# Patient Record
Sex: Male | Born: 1990 | Race: White | Hispanic: No | Marital: Single | Smoking: Current every day smoker
Health system: Southern US, Community
[De-identification: ages and names within clinical notes are randomized; demographics above are authoritative.]

---
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
## 2012-08-02 ENCOUNTER — Encounter (HOSPITAL_COMMUNITY): Payer: Self-pay | Admitting: *Deleted

## 2012-08-02 ENCOUNTER — Emergency Department (HOSPITAL_COMMUNITY)
Admission: EM | Admit: 2012-08-02 | Discharge: 2012-08-02 | Disposition: A | Discharge status: Home / Self Care | Payer: 59 | Attending: Emergency Medicine | Admitting: Emergency Medicine

## 2012-08-02 ENCOUNTER — Emergency Department (HOSPITAL_COMMUNITY): Payer: 59

## 2012-08-02 DIAGNOSIS — S61419A Laceration without foreign body of unspecified hand, initial encounter: Secondary | ICD-10-CM

## 2012-08-02 DIAGNOSIS — S60151A Contusion of right little finger with damage to nail, initial encounter: Secondary | ICD-10-CM

## 2012-08-02 DIAGNOSIS — Y929 Unspecified place or not applicable: Secondary | ICD-10-CM | POA: Insufficient documentation

## 2012-08-02 DIAGNOSIS — S6000XA Contusion of unspecified finger without damage to nail, initial encounter: Secondary | ICD-10-CM | POA: Insufficient documentation

## 2012-08-02 DIAGNOSIS — S61209A Unspecified open wound of unspecified finger without damage to nail, initial encounter: Secondary | ICD-10-CM | POA: Insufficient documentation

## 2012-08-02 DIAGNOSIS — F17200 Nicotine dependence, unspecified, uncomplicated: Secondary | ICD-10-CM | POA: Insufficient documentation

## 2012-08-02 DIAGNOSIS — Y939 Activity, unspecified: Secondary | ICD-10-CM | POA: Insufficient documentation

## 2012-08-02 DIAGNOSIS — S6990XA Unspecified injury of unspecified wrist, hand and finger(s), initial encounter: Secondary | ICD-10-CM

## 2012-08-02 MED ORDER — HYDROCODONE-ACETAMINOPHEN 5-325 MG PO TABS: 2.0000 | ORAL_TABLET | ORAL | Status: DC | PRN

## 2012-08-02 NOTE — ED Provider Notes (Signed)
Medical screening examination/treatment/procedure(s) were performed by non-physician practitioner and as supervising physician I was immediately available for consultation/collaboration.   Aeryn Medici L Oluwaseun Cremer, MD 08/02/12 1724 

## 2012-08-02 NOTE — ED Notes (Signed)
To ED for eval of right small finger injury yesterday. Pt states he slammed finger in car door. Nail bed appear to have blood under it. Decreased rom of finger.

## 2012-08-02 NOTE — ED Provider Notes (Signed)
History     CSN: [id number]  Arrival date & time 08/02/12  1044   First MD Initiated Contact with Patient 08/02/12 1151      Chief Complaint  Patient presents with  . Finger Injury    (Consider location/radiation/quality/duration/timing/severity/associated sxs/prior treatment) The history is provided by the patient and medical records.    Sean Decker is a 22 y.o. male  with No medical Hx presents to the Emergency Department complaining of acute, persistent, progressively worsening right finger pain onset yesterday morning.  He states he closed his finger in a car door. He states he had pain immediately afterwards and it has progressively gotten worse. His biggest concern is the blood underneath his nail. Associated symptoms include swelling, pain, bruising, decreased ROM.  Nothing makes it better and palpation, movemnt makes it worse.  Pt denies fever, chills, headache, neck pain, back pain, chest pain, abdominal pain, weakness, dizziness, numbness, tingling, loss of function of the finger or hand.     History reviewed. No pertinent past medical history.  History reviewed. No pertinent past surgical history.  History reviewed. No pertinent family history.  History  Substance Use Topics  . Smoking status: Current Every Day Smoker    Types: Cigarettes  . Smokeless tobacco: Not on file  . Alcohol Use: No      Review of Systems  Musculoskeletal: Positive for joint swelling and arthralgias.  Skin: Positive for wound.  Neurological: Negative for numbness.  All other systems reviewed and are negative.    Allergies  Review of patient's allergies indicates no known allergies.  Home Medications   Current Outpatient Rx  Name  Route  Sig  Dispense  Refill  . IBUPROFEN 200 MG PO TABS   Oral   Take 400 mg by mouth every 6 (six) hours as needed. For pain         . HYDROCODONE-ACETAMINOPHEN 5-325 MG PO TABS   Oral   Take 2 tablets by mouth every 4 (four) hours as needed  for pain.   6 tablet   0     BP 128/75  Pulse 90  Temp 98.6 F (37 C) (Oral)  Resp 16  SpO2 97%  Physical Exam  Nursing note and vitals reviewed. Constitutional: He appears well-developed and well-nourished. No distress.  HENT:  Head: Normocephalic and atraumatic.  Eyes: Conjunctivae normal are normal. No scleral icterus.  Cardiovascular: Normal rate, regular rhythm, normal heart sounds and intact distal pulses.  Exam reveals no gallop and no friction rub.   No murmur heard.      Capillary refill less than 3 seconds  Pulmonary/Chest: Effort normal and breath sounds normal. No respiratory distress. He has no wheezes.  Musculoskeletal: Normal range of motion. He exhibits tenderness. He exhibits no edema.       Hands:      ROM: Full range of motion with minimal pain  Neurological: He is alert. Coordination normal.       Sensation intact to dull and sharp Strength 5 out of 5 in the right little finger, other right fingers, right wrist, right elbow, right shoulder, strong and equal grip strength  Skin: Skin is warm and dry. He is not diaphoretic.    ED Course  Cauterization Date/Time: 08/02/2012 2:35 PM Performed by: Dierdre Forth Authorized by: Dierdre Forth Consent: Verbal consent obtained. Risks and benefits: risks, benefits and alternatives were discussed Consent given by: patient Patient understanding: patient states understanding of the procedure being performed Patient consent: the patient's  understanding of the procedure matches consent given Procedure consent: procedure consent matches procedure scheduled Relevant documents: relevant documents present and verified Test results: test results available and properly labeled Site marked: the operative site was marked Imaging studies: imaging studies available Required items: required blood products, implants, devices, and special equipment available Patient identity confirmed: verbally with patient Time  out: Immediately prior to procedure a "time out" was called to verify the correct patient, procedure, equipment, support staff and site/side marked as required. Local anesthesia used: no Patient sedated: no Patient tolerance: Patient tolerated the procedure well with no immediate complications. Comments: Subungual hematoma alleviated with cautery. Patient tolerated procedure with no complications.   (including critical care time)  Labs Reviewed - No data to display Dg Finger Little Right  08/02/2012  *RADIOLOGY REPORT*  Clinical Data: Crush injury.  Pain.  RIGHT LITTLE FINGER 2+V  Comparison: Hand films from 07/30/2007  Findings: Three-view exam shows no fracture.  No subluxation or dislocation.  No worrisome lytic or sclerotic osseous abnormality.  IMPRESSION: Normal exam.   Original Report Authenticated By: Kennith Center, M.D.      1. Finger injury   2. Superficial laceration of hand   3. Subungual hematoma of fifth finger of right hand       MDM  Valetta Mole presents with right little finger injury.  Exam patient with superficial laceration to the right little finger and subungual hematoma.  X-ray without evidence of fracture subluxation or dislocation.  Subungual hematoma alleviated with cautery.  He states he feels much better.  Discussed adequate wound care, including washing with warm soap and water several times a day and inspecting it for signs of infection.   1. Medications: Vicodin, usual home medications 2. Treatment: rest, drink plenty of fluids, medication as prescribed, keep wound clean and dry 3. Follow Up: Please followup with your primary doctor for discussion of your diagnoses and further evaluation after today's visit; if you do not have a primary care doctor use the resource guide provided to find one;           MDM Number of Diagnoses or Management Options Finger injury:  Subungual hematoma of fifth finger of right hand:  Superficial laceration of hand:           Dierdre Forth, PA-C 08/02/12 1438

## 2012-08-02 NOTE — ED Notes (Addendum)
Pt presents to department for evaluation of R 5th digit injury. States he slammed finger in car door yesterday. Now states pain and numbness to finger. Decreases ROM, blood noted under nail bed. 5/10 pain at the time. No other injuries noted.

## 2012-08-02 NOTE — ED Notes (Signed)
Pt discharged home. Has no further questions at the time.  

## 2013-02-15 ENCOUNTER — Emergency Department (HOSPITAL_COMMUNITY): Payer: 59

## 2013-02-15 ENCOUNTER — Emergency Department (HOSPITAL_COMMUNITY)
Admission: EM | Admit: 2013-02-15 | Discharge: 2013-02-15 | Disposition: A | Discharge status: Home / Self Care | Payer: 59 | Attending: Emergency Medicine | Admitting: Emergency Medicine

## 2013-02-15 ENCOUNTER — Encounter (HOSPITAL_COMMUNITY): Payer: Self-pay

## 2013-02-15 DIAGNOSIS — F17200 Nicotine dependence, unspecified, uncomplicated: Secondary | ICD-10-CM | POA: Insufficient documentation

## 2013-02-15 DIAGNOSIS — R109 Unspecified abdominal pain: Secondary | ICD-10-CM | POA: Insufficient documentation

## 2013-02-15 DIAGNOSIS — R112 Nausea with vomiting, unspecified: Secondary | ICD-10-CM | POA: Insufficient documentation

## 2013-02-15 LAB — URINALYSIS, ROUTINE W REFLEX MICROSCOPIC
Bilirubin Urine: NEGATIVE
Glucose, UA: 100 mg/dL — AB
Hgb urine dipstick: NEGATIVE
Ketones, ur: 15 mg/dL — AB
Leukocytes, UA: NEGATIVE
Nitrite: NEGATIVE
Protein, ur: NEGATIVE mg/dL
Specific Gravity, Urine: 1.01 (ref 1.005–1.030)
Urobilinogen, UA: 1 mg/dL (ref 0.0–1.0)
pH: 6 (ref 5.0–8.0)

## 2013-02-15 LAB — COMPREHENSIVE METABOLIC PANEL
Alkaline Phosphatase: 98 U/L (ref 39–117)
GFR calc Af Amer: >90 mL/min (ref >90)
GFR calc non Af Amer: >90 mL/min (ref >90)
Potassium: 4.1 mEq/L (ref 3.5–5.1)
Sodium: 140 mEq/L (ref 135–145)
Total Bilirubin: 0.7 mg/dL (ref 0.3–1.2)

## 2013-02-15 LAB — CG4 I-STAT (LACTIC ACID): Lactic Acid, Venous: 0.89 mmol/L (ref 0.5–2.2)

## 2013-02-15 LAB — CBC WITH DIFFERENTIAL/PLATELET
Basophils Absolute: 0 10*3/uL (ref 0.0–0.1)
Basophils Relative: 0 % (ref 0–1)
Hemoglobin: 17.8 g/dL — ABNORMAL HIGH (ref 13.0–17.0)
MCH: 31.3 pg (ref 26.0–34.0)
MCV: 84.9 fL (ref 78.0–100.0)
Monocytes Absolute: 0.8 10*3/uL (ref 0.1–1.0)
Monocytes Relative: 7 % (ref 3–12)
Neutro Abs: 6.7 10*3/uL (ref 1.7–7.7)
Platelets: 244 10*3/uL (ref 150–400)
RBC: 5.68 MIL/uL (ref 4.22–5.81)
RDW: 13.3 % (ref 11.5–15.5)
WBC: 10.4 10*3/uL (ref 4.0–10.5)

## 2013-02-15 LAB — LIPASE, BLOOD: Lipase: 18 U/L (ref 11–59)

## 2013-02-15 MED ORDER — IOHEXOL 300 MG/ML  SOLN: 50.0000 mL | Freq: Once | INTRAMUSCULAR | Status: DC | PRN

## 2013-02-15 MED ORDER — IOHEXOL 300 MG/ML  SOLN
100.0000 mL | Freq: Once | INTRAMUSCULAR | Status: AC | PRN
  Administered 2013-02-15: 100 mL via INTRAVENOUS

## 2013-02-15 MED ORDER — HYDROMORPHONE HCL PF 1 MG/ML IJ SOLN
1.0000 mg | Freq: Once | INTRAMUSCULAR | Status: AC
  Administered 2013-02-15: 1 mg via INTRAVENOUS
  Filled 2013-02-15: qty 1

## 2013-02-15 MED ORDER — ONDANSETRON HCL 4 MG/2ML IJ SOLN
4.0000 mg | Freq: Once | INTRAMUSCULAR | Status: AC
  Administered 2013-02-15: 4 mg via INTRAVENOUS
  Filled 2013-02-15: qty 2

## 2013-02-15 MED ORDER — SODIUM CHLORIDE 0.9 % IV BOLUS (SEPSIS)
1000.0000 mL | Freq: Once | INTRAVENOUS | Status: AC
  Administered 2013-02-15: 1000 mL via INTRAVENOUS

## 2013-02-15 MED ORDER — OXYCODONE-ACETAMINOPHEN 5-325 MG PO TABS: 2.0000 | ORAL_TABLET | Freq: Four times a day (QID) | ORAL | Status: DC | PRN

## 2013-02-15 NOTE — ED Notes (Signed)
Abdominal pain began 3 days ago and progressively has become worse,  Denies any vomiting today or diahrheea.

## 2013-02-15 NOTE — ED Provider Notes (Signed)
History    CSN: [id number] Arrival date & time 02/15/13  1548  First MD Initiated Contact with Patient 02/15/13 1636     Chief Complaint  Patient presents with  . Abdominal Pain   (Consider location/radiation/quality/duration/timing/severity/associated sxs/prior Treatment) HPI Comments: Patient presents emergency department with chief complaint of abdominal pain which began 3 days ago. States that it is progressively worsened. He endorses nausea and vomiting, but has not had any diarrhea or constipation. He states that his bowel movements have been regular. He does endorse some dysuria. He denies any hematemesis or hematochezia. He states his pain is 10 out of 10. It is better when he bends over, and worse when he stands up straight. He states that he has not taken anything to alleviate his symptoms. He denies fevers, chills, chest pain, or shortness of breath. He has never experienced before. Has never had surgery on his abdomen.   The history is provided by the patient. No language interpreter was used.   History reviewed. No pertinent past medical history. History reviewed. No pertinent past surgical history. No family history on file. History  Substance Use Topics  . Smoking status: Current Every Day Smoker    Types: Cigarettes  . Smokeless tobacco: Not on file  . Alcohol Use: Yes    Review of Systems  All other systems reviewed and are negative.    Allergies  Review of patient's allergies indicates no known allergies.  Home Medications   Current Outpatient Rx  Name  Route  Sig  Dispense  Refill  . HYDROcodone-acetaminophen (NORCO/VICODIN) 5-325 MG per tablet   Oral   Take 2 tablets by mouth every 4 (four) hours as needed for pain.   6 tablet   0   . ibuprofen (ADVIL,MOTRIN) 200 MG tablet   Oral   Take 400 mg by mouth every 6 (six) hours as needed. For pain          BP 134/94  Pulse 60  Temp(Src) 97.9 F (36.6 C) (Oral)  Resp 20  SpO2 98% Physical Exam   Nursing note and vitals reviewed. Constitutional: He is oriented to person, place, and time. He appears well-developed and well-nourished.  HENT:  Head: Normocephalic and atraumatic.  Eyes: Conjunctivae and EOM are normal. Right eye exhibits no discharge. Left eye exhibits no discharge. No scleral icterus.  Neck: Normal range of motion. Neck supple. No JVD present.  Cardiovascular: Normal rate, regular rhythm, normal heart sounds and intact distal pulses.  Exam reveals no gallop and no friction rub.   No murmur heard. Pulmonary/Chest: Effort normal and breath sounds normal. No respiratory distress. He has no wheezes. He has no rales. He exhibits no tenderness.  Abdominal: Soft. Bowel sounds are normal. He exhibits no distension and no mass. There is tenderness. There is guarding. There is no rebound.  Diffusely tender abdomen, with guarding,   Genitourinary: Penis normal. No penile tenderness.  Circumcised male, no pain on palpation of testicles, no masses, no hernias, no discharge, cremasteric reflexes intact  Musculoskeletal: Normal range of motion. He exhibits no edema and no tenderness.  Neurological: He is alert and oriented to person, place, and time.  CN 3-12 intact  Skin: Skin is warm and dry.  Psychiatric: He has a normal mood and affect. His behavior is normal. Judgment and thought content normal.    ED Course  Procedures (including critical care time) Labs Reviewed  CBC WITH DIFFERENTIAL - Abnormal; Notable for the following:    Hemoglobin 17.8 (*)  MCHC 36.9 (*)    All other components within normal limits  COMPREHENSIVE METABOLIC PANEL - Abnormal; Notable for the following:    Glucose, Bld 105 (*)    All other components within normal limits  LIPASE, BLOOD  URINALYSIS, ROUTINE W REFLEX MICROSCOPIC   Results for orders placed during the hospital encounter of 02/15/13  CBC WITH DIFFERENTIAL      Result Value Range   WBC 10.4  4.0 - 10.5 K/uL   RBC 5.68  4.22 - 5.81  MIL/uL   Hemoglobin 17.8 (*) 13.0 - 17.0 g/dL   HCT 40.9  81.1 - 91.4 %   MCV 84.9  78.0 - 100.0 fL   MCH 31.3  26.0 - 34.0 pg   MCHC 36.9 (*) 30.0 - 36.0 g/dL   RDW 78.2  95.6 - 21.3 %   Platelets 244  150 - 400 K/uL   Neutrophils Relative % 65  43 - 77 %   Neutro Abs 6.7  1.7 - 7.7 K/uL   Lymphocytes Relative 26  12 - 46 %   Lymphs Abs 2.7  0.7 - 4.0 K/uL   Monocytes Relative 7  3 - 12 %   Monocytes Absolute 0.8  0.1 - 1.0 K/uL   Eosinophils Relative 1  0 - 5 %   Eosinophils Absolute 0.1  0.0 - 0.7 K/uL   Basophils Relative 0  0 - 1 %   Basophils Absolute 0.0  0.0 - 0.1 K/uL  COMPREHENSIVE METABOLIC PANEL      Result Value Range   Sodium 140  135 - 145 mEq/L   Potassium 4.1  3.5 - 5.1 mEq/L   Chloride 101  96 - 112 mEq/L   CO2 26  19 - 32 mEq/L   Glucose, Bld 105 (*) 70 - 99 mg/dL   BUN 11  6 - 23 mg/dL   Creatinine, Ser 0.86  0.50 - 1.35 mg/dL   Calcium 57.8  8.4 - 46.9 mg/dL   Total Protein 7.8  6.0 - 8.3 g/dL   Albumin 4.8  3.5 - 5.2 g/dL   AST 18  0 - 37 U/L   ALT 15  0 - 53 U/L   Alkaline Phosphatase 98  39 - 117 U/L   Total Bilirubin 0.7  0.3 - 1.2 mg/dL   GFR calc non Af Amer >90  >90 mL/min   GFR calc Af Amer >90  >90 mL/min  LIPASE, BLOOD      Result Value Range   Lipase 18  11 - 59 U/L  URINALYSIS, ROUTINE W REFLEX MICROSCOPIC      Result Value Range   Color, Urine YELLOW  YELLOW   APPearance CLEAR  CLEAR   Specific Gravity, Urine 1.010  1.005 - 1.030   pH 6.0  5.0 - 8.0   Glucose, UA 100 (*) NEGATIVE mg/dL   Hgb urine dipstick NEGATIVE  NEGATIVE   Bilirubin Urine NEGATIVE  NEGATIVE   Ketones, ur 15 (*) NEGATIVE mg/dL   Protein, ur NEGATIVE  NEGATIVE mg/dL   Urobilinogen, UA 1.0  0.0 - 1.0 mg/dL   Nitrite NEGATIVE  NEGATIVE   Leukocytes, UA NEGATIVE  NEGATIVE  CG4 I-STAT (LACTIC ACID)      Result Value Range   Lactic Acid, Venous 0.89  0.5 - 2.2 mmol/L   Ct Abdomen Pelvis W Contrast  02/15/2013   *RADIOLOGY REPORT*  Clinical Data: Severe right  lower quadrant abdominal pain, nausea and vomiting, and low grade fever 3  days ago which resolved. Recurrent bilateral lower abdominal pain and pelvic pain today.  CT ABDOMEN AND PELVIS WITH CONTRAST  Technique:  Multidetector CT imaging of the abdomen and pelvis was performed following the standard protocol during bolus administration of intravenous contrast.  Contrast: OMNIPAQUE IOHEXOL 300 MG/ML IV. Oral contrast was also administered.  Comparison: None.  Findings: Normal appearing long gas-filled appendix in the right upper pelvis.  Gas throughout upper normal caliber colon from cecum to rectum.  Normal-appearing stomach and small bowel.  No ascites.  Normal or early portal venous phase appearance of the liver, spleen, pancreas, adrenal glands, and kidneys.  Gallbladder unremarkable by CT.  No biliary ductal dilation.  Normal vascular structures.  No significant lymphadenopathy.  Urinary bladder unremarkable.  Prostate gland normal for age. Small bilateral seminal vesicle cysts.  Bone window images unremarkable.  Visualized lung bases clear. Heart size normal.  IMPRESSION: No acute or significant abnormalities involving the abdomen or pelvis.  Bilateral small seminal vesicle cysts.   Original Report Authenticated By: Hulan Saas, M.D.     1. Abdominal  pain, other specified site     MDM  Patient with severe abdominal pain x3 days, afebrile, some nausea and vomiting. Treat with a lot of, fluids, check basic labs, and will get a CT of the abdomen.    8:21 PM Patient's labs, vitals, and imaging are negative.  Patient seen by and discussed with Dr. Dan Humphreys, who recommends an additional liter of fluid, then discharge to home.  Recommend that the patient come back in 24 hours for a recheck.  Discussed with the patient.  He understands and agrees with the plan. Discharge home with pain medicine.  Patient's pain is largely improved in the ED.    Roxy Horseman, PA-C 02/15/13 2314    ATTENDING  ATTESTATION FOR [id number]  Medical screening examination/treatment/procedure(s) were conducted as a shared visit with non-physician practitioner(s) and myself.  I personally evaluated the patient during the encounter I reviewed the patient's history and fully examined him. He does continue to have some tenderness to palpation diffusely without guarding; however, his imaging is all negative. His labs are also reassuring. We'll attempt further pain management as well as an additional IV fluid bolus.  He is also tolerating PO in the department.  Will recommend patient to return for 24 hour recheck.  Ashby Dawes, MD 03/04/13 [phone]

## 2013-02-19 ENCOUNTER — Emergency Department (HOSPITAL_COMMUNITY)
Admission: EM | Admit: 2013-02-19 | Discharge: 2013-02-19 | Disposition: A | Discharge status: Home / Self Care | Payer: 59 | Attending: Emergency Medicine | Admitting: Emergency Medicine

## 2013-02-19 ENCOUNTER — Emergency Department (HOSPITAL_COMMUNITY): Payer: 59

## 2013-02-19 ENCOUNTER — Encounter (HOSPITAL_COMMUNITY): Payer: Self-pay | Admitting: *Deleted

## 2013-02-19 DIAGNOSIS — R109 Unspecified abdominal pain: Secondary | ICD-10-CM | POA: Insufficient documentation

## 2013-02-19 DIAGNOSIS — F17200 Nicotine dependence, unspecified, uncomplicated: Secondary | ICD-10-CM | POA: Insufficient documentation

## 2013-02-19 DIAGNOSIS — R110 Nausea: Secondary | ICD-10-CM | POA: Insufficient documentation

## 2013-02-19 LAB — COMPREHENSIVE METABOLIC PANEL
BUN: 5 mg/dL — ABNORMAL LOW (ref 6–23)
Calcium: 9.6 mg/dL (ref 8.4–10.5)
Creatinine, Ser: 0.8 mg/dL (ref 0.50–1.35)
GFR calc non Af Amer: >90 mL/min (ref >90)
Potassium: 4.1 mEq/L (ref 3.5–5.1)
Total Protein: 7 g/dL (ref 6.0–8.3)

## 2013-02-19 LAB — URINALYSIS, ROUTINE W REFLEX MICROSCOPIC
Bilirubin Urine: NEGATIVE
Glucose, UA: NEGATIVE mg/dL
Protein, ur: NEGATIVE mg/dL
Specific Gravity, Urine: 1.011 (ref 1.005–1.030)
Urobilinogen, UA: 0.2 mg/dL (ref 0.0–1.0)

## 2013-02-19 LAB — CBC WITH DIFFERENTIAL/PLATELET
Eosinophils Absolute: 0.1 10*3/uL (ref 0.0–0.7)
Eosinophils Relative: 1 % (ref 0–5)
HCT: 44.9 % (ref 39.0–52.0)
Lymphs Abs: 1.2 10*3/uL (ref 0.7–4.0)
Monocytes Relative: 4 % (ref 3–12)
Neutro Abs: 5.7 10*3/uL (ref 1.7–7.7)
RDW: 13.4 % (ref 11.5–15.5)

## 2013-02-19 MED ORDER — ONDANSETRON HCL 4 MG/2ML IJ SOLN
4.0000 mg | INTRAMUSCULAR | Status: AC
  Administered 2013-02-19: 4 mg via INTRAVENOUS
  Filled 2013-02-19: qty 2

## 2013-02-19 MED ORDER — SODIUM CHLORIDE 0.9 % IV BOLUS (SEPSIS)
1000.0000 mL | Freq: Once | INTRAVENOUS | Status: AC
  Administered 2013-02-19: 1000 mL via INTRAVENOUS

## 2013-02-19 MED ORDER — OXYCODONE-ACETAMINOPHEN 5-325 MG PO TABS: 1.0000 | ORAL_TABLET | Freq: Four times a day (QID) | ORAL | Status: DC | PRN

## 2013-02-19 MED ORDER — IOHEXOL 300 MG/ML  SOLN: 25.0000 mL | INTRAMUSCULAR | Status: AC

## 2013-02-19 MED ORDER — HYDROMORPHONE HCL PF 1 MG/ML IJ SOLN
1.0000 mg | Freq: Once | INTRAMUSCULAR | Status: AC
  Administered 2013-02-19: 1 mg via INTRAVENOUS

## 2013-02-19 MED ORDER — IOHEXOL 300 MG/ML  SOLN
100.0000 mL | Freq: Once | INTRAMUSCULAR | Status: AC | PRN
  Administered 2013-02-19: 80 mL via INTRAVENOUS

## 2013-02-19 NOTE — ED Notes (Signed)
Pt states that he was told to come back if abdominal pain did not improve.  Pt reports 5 day history of lower abdominal pain.  Pt denies NVD or constipation.  Pt states he cannot eat.  Pt doubled over.

## 2013-02-19 NOTE — ED Provider Notes (Signed)
History    CSN: [id number] Arrival date & time 02/19/13  1139  First MD Initiated Contact with Patient 02/19/13 1158     Chief Complaint  Patient presents with  . Abdominal Pain   (Consider location/radiation/quality/duration/timing/severity/associated sxs/prior Treatment) HPI Comments: Patient is a 22 y/o male with no significant PMH who presents for lower abdominal pain x 1 week. Patient states that pain is below his belly button in his left and right lower abdomen, waxing and waning in severity, sharp in nature, and radiating to his low back intermittently. Pain is improved when in the fetal position and worse when supine, walking, or coughing. Patient admits to associated nausea; he denies emesis since he was last seen for this pain on 02/15/13. Denies hx of abdominal surgeries.  Patient is a 22 y.o. male presenting with abdominal pain. The history is provided by the patient. No language interpreter was used.  Abdominal Pain This is a recurrent problem. The current episode started in the past 7 days. Associated symptoms include abdominal pain and nausea. Pertinent negatives include no chills, coughing, diaphoresis, fever, numbness, vomiting or weakness. The symptoms are aggravated by walking and coughing. Treatments tried: Percocet. The treatment provided mild relief.   History reviewed. No pertinent past medical history. History reviewed. No pertinent past surgical history. No family history on file. History  Substance Use Topics  . Smoking status: Current Every Day Smoker    Types: Cigarettes  . Smokeless tobacco: Not on file  . Alcohol Use: Yes    Review of Systems  Constitutional: Negative for fever, chills and diaphoresis.  Respiratory: Negative for cough.   Gastrointestinal: Positive for nausea and abdominal pain. Negative for vomiting.  Genitourinary: Negative for dysuria, hematuria, discharge, penile swelling, scrotal swelling and testicular pain.  Neurological:  Negative for weakness and numbness.  All other systems reviewed and are negative.    Allergies  Review of patient's allergies indicates no known allergies.  Home Medications   Current Outpatient Rx  Name  Route  Sig  Dispense  Refill  . oxyCODONE-acetaminophen (PERCOCET/ROXICET) 5-325 MG per tablet   Oral   Take 2 tablets by mouth every 6 (six) hours as needed for pain.   15 tablet   0    BP 128/79  Pulse 69  Temp(Src) 98.2 F (36.8 C) (Oral)  Resp 18  SpO2 98%  Physical Exam  Nursing note and vitals reviewed. Constitutional: He is oriented to person, place, and time. He appears well-developed and well-nourished. No distress.  HENT:  Head: Normocephalic and atraumatic.  Mouth/Throat: Oropharynx is clear and moist. No oropharyngeal exudate.  Eyes: Conjunctivae and EOM are normal. Pupils are equal, round, and reactive to light. No scleral icterus.  Neck: Normal range of motion. Neck supple.  Cardiovascular: Normal rate, regular rhythm and normal heart sounds.   Pulmonary/Chest: Effort normal and breath sounds normal. No respiratory distress. He has no wheezes. He has no rales.  Abdominal: Soft. He exhibits no distension and no mass. There is tenderness (generalized, nonfocal). There is guarding (involuntary). There is no rebound. Hernia confirmed negative in the right inguinal area and confirmed negative in the left inguinal area.  Genitourinary: Penis normal. Right testis shows no mass, no swelling and no tenderness. Right testis is descended. Cremasteric reflex is not absent on the right side. Left testis shows no mass, no swelling and no tenderness. Left testis is descended. Cremasteric reflex is not absent on the left side. Circumcised. No penile erythema or penile tenderness. No  discharge found.  Chaperoned GU exam without significant findings  Musculoskeletal: Normal range of motion.  Neurological: He is alert and oriented to person, place, and time.  Skin: Skin is warm  and dry. No rash noted. He is not diaphoretic. No erythema. No pallor.  Psychiatric: He has a normal mood and affect. His behavior is normal.   ED Course  Procedures (including critical care time) Labs Reviewed  COMPREHENSIVE METABOLIC PANEL - Abnormal; Notable for the following:    Glucose, Bld 114 (*)    BUN 5 (*)    All other components within normal limits  CBC WITH DIFFERENTIAL - Abnormal; Notable for the following:    Neutrophils Relative % 78 (*)    All other components within normal limits  LIPASE, BLOOD - Abnormal; Notable for the following:    Lipase 10 (*)    All other components within normal limits  URINALYSIS, ROUTINE W REFLEX MICROSCOPIC   No results found. No diagnosis found.  MDM  Patient presents for abdominal pain x 1 week. Seen and evaluated for the same on 02/15/13 with negative work up. Physical exam as above. Patient visibly uncomfortable, but hemodynamically stable and afebrile. No leukocytosis, anemia, hemoconcentration, or electrolyte imbalance on labs. Liver and kidney function preserved. UA without evidence of blood or infection. No evidence to suspect testicular torsion on chaperoned GU exam. Pain well controlled with 1mg  IV Dilaudid. Second abdominal CT ordered for further evaluation of symptoms. Anticipate d/c with GI follow up if no acute processes identified.  Patient signed out to [employer], PA-C at end of shift.     Antony Madura, PA-C 02/19/13 1623

## 2013-02-19 NOTE — ED Provider Notes (Signed)
4:00 PM Assumed care of patient from [facility], PA-C.  Patient presenting with abdominal pain.  He was also seen in the ED for abdominal pain on 02-15-13 and had a CT of the ab/pelvis done at that time, which was negative.  Today labs are unremarkable.  UA unremarkable.  Repeat CT pending.  Plan is for the patient to be discharged home with GI follow up if CT negative.  5:00 PM CT negative.  Discussed results with patient.  Will discharge home with pain medications and GI referral.  Magnus Sinning, PA-C 02/19/13 1725

## 2013-02-20 ENCOUNTER — Emergency Department (HOSPITAL_COMMUNITY)
Admission: EM | Admit: 2013-02-20 | Discharge: 2013-02-20 | Discharge status: Against Medical Advice | Payer: 59 | Attending: Emergency Medicine | Admitting: Emergency Medicine

## 2013-02-20 DIAGNOSIS — R079 Chest pain, unspecified: Secondary | ICD-10-CM | POA: Insufficient documentation

## 2013-02-20 MED ORDER — ASPIRIN 325 MG PO TABS: 325.0000 mg | ORAL_TABLET | ORAL | Status: DC

## 2013-02-20 NOTE — ED Notes (Signed)
Per EMS: Pt reports intermittent left sided 10/10 chest pain and lower abd pain x 2 weeks. Pt denies SOB, N/V. 70 SR, 12 lead unremarkable. 130/80.

## 2013-02-20 NOTE — ED Notes (Signed)
Patient reports not wanting to be seen anymore.  Patient states "I am feeling better, I want to leave"  Patient was made aware of leaving against medical advice. Patient left be for we were able to get vital signs and 12 lead.

## 2013-02-21 NOTE — ED Provider Notes (Signed)
Medical screening examination/treatment/procedure(s) were conducted as a shared visit with non-physician practitioner(s) and myself.  I personally evaluated the patient during the encounter Pt with mid abd pain, constant, dull. Tenderness on exam. No scrotal or testicular pain, swelling, or tenderness. No hernia. Ct.   Suzi Roots, MD 02/21/13 [phone]

## 2013-02-21 NOTE — ED Provider Notes (Signed)
Medical screening examination/treatment/procedure(s) were conducted as a shared visit with non-physician practitioner(s) and myself.  I personally evaluated the patient during the encounter Pt c/o mid abd pain for past couple days. Constant. Mod/severe. Mid abd tenderness on exam. No scrotal or testicular pain, swelling, or tenderness. Labs. Ct.     Suzi Roots, MD 02/21/13 [phone]

## 2014-02-17 ENCOUNTER — Emergency Department (HOSPITAL_COMMUNITY)
Admission: EM | Admit: 2014-02-17 | Discharge: 2014-02-17 | Disposition: A | Discharge status: Home / Self Care | Payer: BC Managed Care – PPO | Attending: Emergency Medicine | Admitting: Emergency Medicine

## 2014-02-17 ENCOUNTER — Encounter (HOSPITAL_COMMUNITY): Payer: Self-pay | Admitting: Emergency Medicine

## 2014-02-17 DIAGNOSIS — L0291 Cutaneous abscess, unspecified: Secondary | ICD-10-CM

## 2014-02-17 DIAGNOSIS — L03317 Cellulitis of buttock: Principal | ICD-10-CM

## 2014-02-17 DIAGNOSIS — L0231 Cutaneous abscess of buttock: Secondary | ICD-10-CM | POA: Diagnosis present

## 2014-02-17 DIAGNOSIS — F17200 Nicotine dependence, unspecified, uncomplicated: Secondary | ICD-10-CM | POA: Diagnosis not present

## 2014-02-17 MED ORDER — SULFAMETHOXAZOLE-TRIMETHOPRIM 800-160 MG PO TABS: 1.0000 | ORAL_TABLET | Freq: Two times a day (BID) | ORAL | Status: DC

## 2014-02-17 NOTE — ED Notes (Signed)
The pt has a boil on his buttocks for 2 weeks getting worse each day

## 2014-02-17 NOTE — ED Provider Notes (Signed)
CSN: [id number]     Arrival date & time 02/17/14  2106 History   First MD Initiated Contact with Patient 02/17/14 2135     Chief Complaint  Patient presents with  . Abscess     HPI Patient presents with superior gluteal buttock abscess.  Worsening of the past 2 weeks.  If fevers or chills.  No history of prior abscess.  Denies spreading cellulitis.  No other complaints.  Pain is moderate to severe in severity at this time.  Pain is worse with palpation of the abscess   History reviewed. No pertinent past medical history. History reviewed. No pertinent past surgical history. No family history on file. History  Substance Use Topics  . Smoking status: Current Every Day Smoker    Types: Cigarettes  . Smokeless tobacco: Not on file  . Alcohol Use: Yes    Review of Systems  All other systems reviewed and are negative.     Allergies  Review of patient's allergies indicates no known allergies.  Home Medications   Prior to Admission medications   Medication Sig Start Date End Date Taking? Authorizing Provider  oxyCODONE-acetaminophen (PERCOCET/ROXICET) 5-325 MG per tablet Take 2 tablets by mouth every 6 (six) hours as needed for pain. 02/15/13   Roxy Horsemanobert Browning, PA-C  oxyCODONE-acetaminophen (PERCOCET/ROXICET) 5-325 MG per tablet Take 1-2 tablets by mouth every 6 (six) hours as needed for pain. 02/19/13   Heather Laisure, PA-C  sulfamethoxazole-trimethoprim (SEPTRA DS) 800-160 MG per tablet Take 1 tablet by mouth every 12 (twelve) hours. 02/17/14   Lyanne CoKevin M Raelle Chambers, MD   BP 129/72  Pulse 70  Temp(Src) 98.4 F (36.9 C)  Resp 28  Ht 5\' 6"  (1.676 m)  Wt 140 lb (63.504 kg)  BMI 22.61 kg/m2  SpO2 100% Physical Exam  Nursing note and vitals reviewed. Constitutional: He is oriented to person, place, and time. He appears well-developed and well-nourished.  HENT:  Head: Normocephalic.  Eyes: EOM are normal.  Neck: Normal range of motion.  Pulmonary/Chest: Effort normal.  Abdominal:  He exhibits no distension.  Musculoskeletal: Normal range of motion.  Superior gluteal cleft abscess.  No surrounding erythema.  No spreading cellulitis.  Fluctuance without drainage  Neurological: He is alert and oriented to person, place, and time.  Psychiatric: He has a normal mood and affect.    ED Course  Procedures (including critical care time)  INCISION AND DRAINAGE Performed by: Lyanne CoAMPOS,Saidee Geremia M Consent: Verbal consent obtained. Risks and benefits: risks, benefits and alternatives were discussed Time out performed prior to procedure Type: abscess Body area: Superior gluteal cleft Anesthesia: local infiltration Incision was made with a scalpel. Local anesthetic: lidocaine 2 % with epinephrine Anesthetic total: 4 ml Complexity: complex Blunt dissection to break up loculations Drainage: purulent Drainage amount: Large  Packing material: None  Patient tolerance: Patient tolerated the procedure well with no immediate complications.     Labs Review Labs Reviewed - No data to display  Imaging Review No results found.   EKG Interpretation None      MDM   Final diagnoses:  Abscess    Drainage of abscess.  Home with antibiotics and warm water soaks.  Understands to return to the ER for new or worsening symptoms    Lyanne CoKevin M Patriciann Becht, MD 02/17/14 2221

## 2014-02-17 NOTE — Discharge Instructions (Signed)

## 2019-03-31 ENCOUNTER — Emergency Department (HOSPITAL_COMMUNITY): Payer: Self-pay

## 2019-03-31 ENCOUNTER — Other Ambulatory Visit: Payer: Self-pay

## 2019-03-31 ENCOUNTER — Encounter (HOSPITAL_COMMUNITY): Payer: Self-pay | Admitting: Emergency Medicine

## 2019-03-31 ENCOUNTER — Emergency Department (HOSPITAL_COMMUNITY)
Admission: EM | Admit: 2019-03-31 | Discharge: 2019-03-31 | Disposition: A | Discharge status: Home / Self Care | Payer: Self-pay | Attending: Emergency Medicine | Admitting: Emergency Medicine

## 2019-03-31 DIAGNOSIS — M542 Cervicalgia: Secondary | ICD-10-CM | POA: Insufficient documentation

## 2019-03-31 DIAGNOSIS — Y9389 Activity, other specified: Secondary | ICD-10-CM | POA: Insufficient documentation

## 2019-03-31 DIAGNOSIS — S0990XA Unspecified injury of head, initial encounter: Secondary | ICD-10-CM

## 2019-03-31 DIAGNOSIS — Y929 Unspecified place or not applicable: Secondary | ICD-10-CM | POA: Insufficient documentation

## 2019-03-31 DIAGNOSIS — S01111A Laceration without foreign body of right eyelid and periocular area, initial encounter: Secondary | ICD-10-CM | POA: Insufficient documentation

## 2019-03-31 DIAGNOSIS — Y999 Unspecified external cause status: Secondary | ICD-10-CM | POA: Insufficient documentation

## 2019-03-31 DIAGNOSIS — Z23 Encounter for immunization: Secondary | ICD-10-CM | POA: Insufficient documentation

## 2019-03-31 DIAGNOSIS — F17210 Nicotine dependence, cigarettes, uncomplicated: Secondary | ICD-10-CM | POA: Insufficient documentation

## 2019-03-31 DIAGNOSIS — S060X0A Concussion without loss of consciousness, initial encounter: Secondary | ICD-10-CM | POA: Insufficient documentation

## 2019-03-31 MED ORDER — ACETAMINOPHEN 325 MG PO TABS
650.0000 mg | ORAL_TABLET | Freq: Once | ORAL | Status: AC
  Administered 2019-03-31: 650 mg via ORAL
  Filled 2019-03-31: qty 2

## 2019-03-31 MED ORDER — TETANUS-DIPHTH-ACELL PERTUSSIS 5-2.5-18.5 LF-MCG/0.5 IM SUSP
0.5000 mL | Freq: Once | INTRAMUSCULAR | Status: AC
  Administered 2019-03-31: 05:00:00 0.5 mL via INTRAMUSCULAR
  Filled 2019-03-31: qty 0.5
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
## 2019-03-31 NOTE — ED Notes (Signed)
Girlfriend is waiting outside for pt to get a room
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
